# Patient Record
Sex: Male | Born: 1970 | Race: White | Hispanic: No | Marital: Married | Smoking: Current every day smoker
Health system: Southern US, Community
[De-identification: ages and names within clinical notes are randomized; demographics above are authoritative.]

## PROBLEM LIST (undated history)

## (undated) DIAGNOSIS — F419 Anxiety disorder, unspecified: Secondary | ICD-10-CM

## (undated) DIAGNOSIS — I341 Nonrheumatic mitral (valve) prolapse: Secondary | ICD-10-CM

## (undated) DIAGNOSIS — I10 Essential (primary) hypertension: Secondary | ICD-10-CM

## (undated) DIAGNOSIS — F410 Panic disorder [episodic paroxysmal anxiety] without agoraphobia: Secondary | ICD-10-CM

## (undated) DIAGNOSIS — N200 Calculus of kidney: Secondary | ICD-10-CM

## (undated) HISTORY — PX: HYDROCELE EXCISION / REPAIR: SUR1145

## (undated) HISTORY — PX: WRIST TENDON TRANSFER: SHX2676

## (undated) HISTORY — PX: OTHER SURGICAL HISTORY: SHX169

---

## 2009-08-01 ENCOUNTER — Emergency Department (HOSPITAL_BASED_OUTPATIENT_CLINIC_OR_DEPARTMENT_OTHER): Admission: EM | Admit: 2009-08-01 | Discharge: 2009-08-01 | Payer: Self-pay | Admitting: Emergency Medicine

## 2009-08-01 ENCOUNTER — Ambulatory Visit: Payer: Self-pay | Admitting: Diagnostic Radiology

## 2009-08-05 ENCOUNTER — Emergency Department (HOSPITAL_BASED_OUTPATIENT_CLINIC_OR_DEPARTMENT_OTHER): Admission: EM | Admit: 2009-08-05 | Discharge: 2009-08-05 | Payer: Self-pay | Admitting: Emergency Medicine

## 2010-09-26 LAB — COMPREHENSIVE METABOLIC PANEL
Albumin: 4.6 g/dL (ref 3.5–5.2)
BUN: 10 mg/dL (ref 6–23)
Calcium: 8.9 mg/dL (ref 8.4–10.5)
Creatinine, Ser: 0.8 mg/dL (ref 0.4–1.5)
GFR calc Af Amer: >60 mL/min (ref >60)
Glucose, Bld: 95 mg/dL (ref 70–99)
Total Protein: 8.1 g/dL (ref 6.0–8.3)

## 2010-09-26 LAB — DIFFERENTIAL
Eosinophils Absolute: 0.2 10*3/uL (ref 0.0–0.7)
Monocytes Absolute: 0.9 10*3/uL (ref 0.1–1.0)
Monocytes Relative: 10 % (ref 3–12)

## 2010-09-26 LAB — CBC
Platelets: 348 10*3/uL (ref 150–400)
RDW: 13.3 % (ref 11.5–15.5)

## 2011-04-28 ENCOUNTER — Emergency Department (HOSPITAL_BASED_OUTPATIENT_CLINIC_OR_DEPARTMENT_OTHER)
Admission: EM | Admit: 2011-04-28 | Discharge: 2011-04-28 | Disposition: A | Discharge status: Home / Self Care | Payer: Self-pay | Attending: Emergency Medicine | Admitting: Emergency Medicine

## 2011-04-28 ENCOUNTER — Other Ambulatory Visit: Payer: Self-pay

## 2011-04-28 ENCOUNTER — Encounter: Payer: Self-pay | Admitting: *Deleted

## 2011-04-28 DIAGNOSIS — T50905A Adverse effect of unspecified drugs, medicaments and biological substances, initial encounter: Secondary | ICD-10-CM

## 2011-04-28 DIAGNOSIS — R61 Generalized hyperhidrosis: Secondary | ICD-10-CM | POA: Insufficient documentation

## 2011-04-28 DIAGNOSIS — T50995A Adverse effect of other drugs, medicaments and biological substances, initial encounter: Secondary | ICD-10-CM | POA: Insufficient documentation

## 2011-04-28 DIAGNOSIS — Y92009 Unspecified place in unspecified non-institutional (private) residence as the place of occurrence of the external cause: Secondary | ICD-10-CM | POA: Insufficient documentation

## 2011-04-28 DIAGNOSIS — G259 Extrapyramidal and movement disorder, unspecified: Secondary | ICD-10-CM | POA: Insufficient documentation

## 2011-04-28 HISTORY — DX: Panic disorder (episodic paroxysmal anxiety): F41.0

## 2011-04-28 HISTORY — DX: Anxiety disorder, unspecified: F41.9

## 2011-04-28 HISTORY — DX: Nonrheumatic mitral (valve) prolapse: I34.1

## 2011-04-28 MED ORDER — LORAZEPAM 1 MG PO TABS
1.0000 mg | ORAL_TABLET | Freq: Once | ORAL | Status: AC
  Administered 2011-04-28: 1 mg via ORAL
  Filled 2011-04-28: qty 1

## 2011-04-28 NOTE — ED Notes (Signed)
Patient states he has a long hx of mild Mitral Valve Prolapse since age 13yrs old.  State today at 0900 am he accidentally took some OTC cold meds with pseudoephrine in it instead of ibuprofen.  States he took 2 pills at 0900 and did not get any relief of his tooth pain and so he took 2 more at 1000.  Became very anxious approximately 45 minutes later and is now worried that he has done something to his heart.  States he has a hx of anxiety and panic attacks.

## 2011-04-28 NOTE — ED Provider Notes (Signed)
History     CSN: [id number] Arrival date & time: 04/28/2011  1:03 PM   First MD Initiated Contact with Patient 04/28/11 1338      Chief Complaint  Patient presents with  . Shaking    took otc cold meds instead of ibuprofen at 9am today.  Feels shakey    (Consider location/radiation/quality/duration/timing/severity/associated sxs/prior treatment) HPI Comments: Pt accidentally took a cold medicine containing sudafed.  He though he was taking ibuprofen.  ~ 1 hr later he started feeling shaky and a "little sweaty" and his heart was pounding hard.  Denies CP.  He took the medicine ~ 1000 and at exam time says he feels like it is wearing off.  He was taking the ibuprofen for dental pain.  The history is provided by the patient and the spouse. No language interpreter was used.    Past Medical History  Diagnosis Date  . MVP (mitral valve prolapse)   . Anxiety   . Panic attacks     Past Surgical History  Procedure Date  . Kidney stones   . Wrist tendon transfer   . Hydrocele excision / repair     History reviewed. No pertinent family history.  History  Substance Use Topics  . Smoking status: Former Smoker -- 1.0 packs/day for 10 years  . Smokeless tobacco: Not on file  . Alcohol Use: Yes     occassionally      Review of Systems  Constitutional: Negative for fever.  HENT: Positive for dental problem.   Cardiovascular: Negative for chest pain.       Tachycardia  All other systems reviewed and are negative.    Allergies  Biaxin  Home Medications   Current Outpatient Rx  Name Route Sig Dispense Refill  . AMOXICILL-CLARITHRO-LANSOPRAZ PO MISC Oral Take by mouth 2 (two) times daily. Follow package directions.     . IBUPROFEN 200 MG PO TABS Oral Take 200 mg by mouth every 6 (six) hours as needed.        BP 110/71  Pulse 120  Temp(Src) 98.9 F (37.2 C) (Oral)  Resp 22  Ht 5\' 5"  (1.651 m)  Wt 190 lb (86.183 kg)  BMI 31.62 kg/m2  SpO2 100%  Physical Exam    Nursing note and vitals reviewed. Constitutional: He is oriented to person, place, and time. He appears well-developed and well-nourished.  HENT:  Head: Normocephalic and atraumatic.  Eyes: EOM are normal.  Neck: Normal range of motion.  Cardiovascular: Regular rhythm, S1 normal, S2 normal, normal heart sounds and intact distal pulses.  Tachycardia present.  PMI is not displaced.  Exam reveals no S3, no S4, no distant heart sounds and no friction rub.   Pulmonary/Chest: Effort normal and breath sounds normal. No accessory muscle usage. Not tachypneic. No respiratory distress.  Abdominal: Soft. He exhibits no distension. There is no tenderness.  Musculoskeletal: Normal range of motion.  Neurological: He is alert and oriented to person, place, and time. No cranial nerve deficit. Coordination normal.  Skin: Skin is warm and dry.  Psychiatric: He has a normal mood and affect. His behavior is normal. Judgment normal.    ED Course  Procedures (including critical care time)  Labs Reviewed - No data to display No results found. 3:36 PM Pt had taken a cold medicine containing pseudoephedrine, and it gave him a tachycardia.  Ativan 1 mg po ordered.  The effect of the pseudoephedrine will wear off, and the Ativan will help calm him down.  1.  Adverse effects of medication       MDM  Explained to pt that the medicine would wear off and his HR would slow down.  He and his wife understand.    Medical screening examination/treatment/procedure(s) were conducted as a shared visit with non-physician practitioner(s) and myself.  I personally evaluated the patient during the encounter Osvaldo Human, M.D.      Worthy Rancher, PA 04/28/11 1411  Carleene Cooper III, MD 04/28/11 [phone]

## 2013-03-03 ENCOUNTER — Emergency Department (HOSPITAL_BASED_OUTPATIENT_CLINIC_OR_DEPARTMENT_OTHER): Payer: Self-pay

## 2013-03-03 ENCOUNTER — Encounter (HOSPITAL_BASED_OUTPATIENT_CLINIC_OR_DEPARTMENT_OTHER): Payer: Self-pay

## 2013-03-03 ENCOUNTER — Emergency Department (HOSPITAL_BASED_OUTPATIENT_CLINIC_OR_DEPARTMENT_OTHER)
Admission: EM | Admit: 2013-03-03 | Discharge: 2013-03-03 | Disposition: A | Discharge status: Home / Self Care | Payer: Self-pay | Attending: Emergency Medicine | Admitting: Emergency Medicine

## 2013-03-03 DIAGNOSIS — M545 Low back pain, unspecified: Secondary | ICD-10-CM | POA: Insufficient documentation

## 2013-03-03 DIAGNOSIS — N451 Epididymitis: Secondary | ICD-10-CM

## 2013-03-03 DIAGNOSIS — Z8679 Personal history of other diseases of the circulatory system: Secondary | ICD-10-CM | POA: Insufficient documentation

## 2013-03-03 DIAGNOSIS — Z8659 Personal history of other mental and behavioral disorders: Secondary | ICD-10-CM | POA: Insufficient documentation

## 2013-03-03 DIAGNOSIS — Z87891 Personal history of nicotine dependence: Secondary | ICD-10-CM | POA: Insufficient documentation

## 2013-03-03 DIAGNOSIS — Z87442 Personal history of urinary calculi: Secondary | ICD-10-CM | POA: Insufficient documentation

## 2013-03-03 DIAGNOSIS — Z792 Long term (current) use of antibiotics: Secondary | ICD-10-CM | POA: Insufficient documentation

## 2013-03-03 DIAGNOSIS — N453 Epididymo-orchitis: Secondary | ICD-10-CM | POA: Insufficient documentation

## 2013-03-03 HISTORY — DX: Calculus of kidney: N20.0

## 2013-03-03 LAB — URINALYSIS, ROUTINE W REFLEX MICROSCOPIC
Ketones, ur: 15 mg/dL — AB
Leukocytes, UA: NEGATIVE
Nitrite: NEGATIVE
Protein, ur: NEGATIVE mg/dL
Urobilinogen, UA: 1 mg/dL (ref 0.0–1.0)

## 2013-03-03 LAB — URINE MICROSCOPIC-ADD ON

## 2013-03-03 MED ORDER — CEFTRIAXONE SODIUM 250 MG IJ SOLR
250.0000 mg | Freq: Once | INTRAMUSCULAR | Status: AC
  Administered 2013-03-03: 250 mg via INTRAMUSCULAR
  Filled 2013-03-03: qty 250

## 2013-03-03 MED ORDER — DOXYCYCLINE HYCLATE 100 MG PO CAPS: 100.0000 mg | ORAL_CAPSULE | Freq: Two times a day (BID) | ORAL | Status: DC

## 2013-03-03 MED ORDER — HYDROCODONE-ACETAMINOPHEN 5-325 MG PO TABS
2.0000 | ORAL_TABLET | Freq: Once | ORAL | Status: AC
  Administered 2013-03-03: 2 via ORAL
  Filled 2013-03-03: qty 2

## 2013-03-03 MED ORDER — LIDOCAINE HCL (PF) 1 % IJ SOLN
INTRAMUSCULAR | Status: AC
  Administered 2013-03-03: 2.1 mL
  Filled 2013-03-03: qty 5

## 2013-03-03 MED ORDER — ONDANSETRON HCL 4 MG PO TABS: 4.0000 mg | ORAL_TABLET | Freq: Four times a day (QID) | ORAL | Status: DC

## 2013-03-03 MED ORDER — ONDANSETRON 4 MG PO TBDP
4.0000 mg | ORAL_TABLET | Freq: Once | ORAL | Status: AC
  Administered 2013-03-03: 4 mg via ORAL
  Filled 2013-03-03: qty 1

## 2013-03-03 MED ORDER — HYDROCODONE-ACETAMINOPHEN 5-325 MG PO TABS: 2.0000 | ORAL_TABLET | ORAL | Status: DC | PRN

## 2013-03-03 NOTE — ED Notes (Signed)
MD at bedside. 

## 2013-03-03 NOTE — ED Notes (Signed)
Left testicle pain x 1 month-"going up into my abd" and points to flank x 3-4 days

## 2013-03-03 NOTE — ED Provider Notes (Signed)
Scribed for No att. providers found, the patient was seen in room MH11/MH11. This chart was scribed by Lewanda Rife, ED scribe. Patient's care was started at 2019  CSN: [id number]     Arrival date & time 03/03/13  2005 History   First MD Initiated Contact with Patient 03/03/13 2016     Chief Complaint  Patient presents with  . Testicle Pain   (Consider location/radiation/quality/duration/timing/severity/associated sxs/prior Treatment) The history is provided by the patient.   HPI Comments: Omar Hardy is a 42 y.o. male who presents to the Emergency Department complaining of waxing and waning moderate left testicular pain radiating to left inguinal region onset 1 month. Reports left testicular pain is constant for past 4 days. Reports associated lower back pain. Reports testicular pain is aggravated when standing up and driving a car and alleviated by nothing. Denies associated penile discharge, hematuria, any urinary/bowel changes or symptoms, abdominal pain, recent injury or trauma, and fevers.Reports PMH of kidney stones, lithotripsy, and hydrocele excision. Denies hx of other abdominal surgery. Reports taking 200 mg of ibuprofen 8 times a day with no relief of symptoms.   Past Medical History  Diagnosis Date  . MVP (mitral valve prolapse)   . Anxiety   . Panic attacks   . Kidney stone    Past Surgical History  Procedure Laterality Date  . Kidney stones    . Wrist tendon transfer    . Hydrocele excision / repair     No family history on file. History  Substance Use Topics  . Smoking status: Former Smoker -- 1.00 packs/day for 10 years  . Smokeless tobacco: Not on file  . Alcohol Use: Yes     Comment: occassionally    Review of Systems  Genitourinary: Positive for testicular pain.  Musculoskeletal: Positive for back pain.   A complete 10 system review of systems was obtained and all systems are negative except as noted in the HPI and PMH.    Allergies   Clarithromycin  Home Medications   Current Outpatient Rx  Name  Route  Sig  Dispense  Refill  . amoxicillin-clarithromycin-lansoprazole (PREVPAC) combo pack   Oral   Take by mouth 2 (two) times daily. Follow package directions.          Marland Kitchen doxycycline (VIBRAMYCIN) 100 MG capsule   Oral   Take 1 capsule (100 mg total) by mouth 2 (two) times daily.   20 capsule   0   . HYDROcodone-acetaminophen (NORCO/VICODIN) 5-325 MG per tablet   Oral   Take 2 tablets by mouth every 4 (four) hours as needed for pain.   10 tablet   0   . ibuprofen (ADVIL,MOTRIN) 200 MG tablet   Oral   Take 200 mg by mouth every 6 (six) hours as needed.           . ondansetron (ZOFRAN) 4 MG tablet   Oral   Take 1 tablet (4 mg total) by mouth every 6 (six) hours.   12 tablet   0    BP 125/70  Pulse 104  Temp(Src) 98.9 F (37.2 C) (Oral)  Resp 16  Ht 5\' 5"  (1.651 m)  Wt 185 lb (83.915 kg)  BMI 30.79 kg/m2  SpO2 99% Physical Exam  Nursing note and vitals reviewed. Constitutional: He is oriented to person, place, and time. He appears well-developed and well-nourished. No distress.  HENT:  Head: Normocephalic and atraumatic.  Eyes: Conjunctivae and EOM are normal.  Neck: Neck supple. No tracheal deviation  present.  Cardiovascular: Normal rate and regular rhythm.   Pulmonary/Chest: Effort normal and breath sounds normal. No respiratory distress.  Abdominal: Soft. Normal appearance and bowel sounds are normal. There is no tenderness. No hernia. Hernia confirmed negative in the right inguinal area and confirmed negative in the left inguinal area.    Genitourinary: Right testis shows no tenderness. Left testis shows tenderness.  Well-healed left inguinal scar at base of scrotum. Tenderness at the base of the left testicle. Normal lie. No overlying skin change. No appreciable hernia. Tenderness over left inguinal region.     Musculoskeletal: Normal range of motion.  Lymphadenopathy:       Right: No  inguinal adenopathy present.       Left: No inguinal adenopathy present.  Neurological: He is alert and oriented to person, place, and time.  Skin: Skin is warm and dry. No erythema.  Psychiatric: He has a normal mood and affect. His behavior is normal.    ED Course  Procedures (including critical care time) Medications  HYDROcodone-acetaminophen (NORCO/VICODIN) 5-325 MG per tablet 2 tablet (2 tablets Oral Given 03/03/13 2034)  ondansetron (ZOFRAN-ODT) disintegrating tablet 4 mg (4 mg Oral Given 03/03/13 2035)  cefTRIAXone (ROCEPHIN) injection 250 mg (250 mg Intramuscular Given 03/03/13 2140)  lidocaine (PF) (XYLOCAINE) 1 % injection (2.1 mLs  Given 03/03/13 2140)    Labs Review Labs Reviewed  URINALYSIS, ROUTINE W REFLEX MICROSCOPIC - Abnormal; Notable for the following:    Specific Gravity, Urine 1.038 (*)    Hgb urine dipstick TRACE (*)    Bilirubin Urine SMALL (*)    Ketones, ur 15 (*)    All other components within normal limits  URINE MICROSCOPIC-ADD ON - Abnormal; Notable for the following:    Crystals CA OXALATE CRYSTALS (*)    All other components within normal limits   Imaging Review Ct Abdomen Pelvis Wo Contrast  03/03/2013   *RADIOLOGY REPORT*  Clinical Data: Left flank and groin pain, history of hematuria  CT ABDOMEN AND PELVIS WITHOUT CONTRAST  Technique:  Multidetector CT imaging of the abdomen and pelvis was performed following the standard protocol without intravenous contrast.  Comparison: 08/01/2009; Testicular ultrasound - earlier same day  Findings:  The lack of intravenous contrast limits the ability to evaluate solid abdominal organs.  There is an approximately 4 mm nonobstructing stone within the inferior pole of the left kidney (image 41, series 2) as well as an additional approximately 3 mm nonobstructing stone in the mid aspect of the left kidney (image 33).  There is a punctate (< 2 mm) nonobstructing stone within the inferior pole of the left kidney. No definite  right-sided renal stones.  No stones are seen along the expected course of either ureter or within the urinary bladder. Normal noncontrast appearance of the urinary bladder.  No urinary obstruction or perinephric stranding.  Normal noncontrast appearance of bilateral adrenal glands, pancreas and spleen.  Incidental note is made of a small splenule.  Normal hepatic contour.  The gallbladder is under distended but otherwise unremarkable on this noncontrast examination.  No ascites.  Scattered colonic diverticulosis without evidence of diverticulitis.  Moderate colonic stool without evidence of obstruction.  Normal noncontrast appearance of the appendix.  No pneumoperitoneum, pneumatosis or portal venous gas.  Normal caliber of the abdominal aorta.  No definite bulky retroperitoneal, mesenteric, pelvic or inguinal lymphadenopathy on this noncontrast examination.  Prostatic calcifications of normal size prostate.  No free fluid in the pelvis.  Limited visualization lower thorax demonstrates  minimal bibasilar subsegmental atelectasis.  Normal heart size.  No pericardial effusion.  No acute or aggressive osseous abnormalities.  Mild degenerative changes of the lumbar spine, likely worse at L5 - S1.  A small limbus body is noted about the anterior aspect of the superior endplate of the L5 vertebral body.  IMPRESSION:  1.  No explanation for the patient's left flank and groin pain. Specifically, no evidence of enteric urinary obstruction. 2.  Nonobstructed left sided nephrolithiasis as above. 3.  Colonic diverticulosis without evidence of diverticulitis.   Original Report Authenticated By: Tacey Ruiz, MD   US Scrotum  03/03/2013   CLINICAL DATA:  Left testicular pain.  EXAM: SCROTAL ULTRASOUND  DOPPLER ULTRASOUND OF THE TESTICLES  TECHNIQUE: Complete ultrasound examination of the testicles, epididymis, and other scrotal structures was performed. Color and spectral Doppler ultrasound were also utilized to evaluate blood  flow to the testicles.  COMPARISON:  None.  FINDINGS: Right testis: 4.7 x 2.2 x 3.2 cm. Normal size and echotexture. No focal abnormality. Normal arterial and venous blood flow.  Left testis: 4.3 x 2.0 x 2.8 cm. Normal size and echotexture. No focal abnormality. Normal arterial and venous blood flow.  Right epididymis:  Normal in size and appearance.  Left epididymis: Increased blood flow, mildly prominent. Findings suggest epididymitis.  Hydrocele:  Small right sided hydrocele.  Varicocele:  Absent  Pulsed Doppler interrogation of both testes demonstrates low resistance arterial and venous waveforms bilaterally.  IMPRESSION: Increased blood flow and heterogeneity within the left epididymis compatible with epididymitis.   Electronically Signed   By: Charlett Nose   On: 03/03/2013 21:27   [place] Art/ven Flow Abd Pelv Doppler  03/03/2013   CLINICAL DATA:  Left testicular pain.  EXAM: SCROTAL ULTRASOUND  DOPPLER ULTRASOUND OF THE TESTICLES  TECHNIQUE: Complete ultrasound examination of the testicles, epididymis, and other scrotal structures was performed. Color and spectral Doppler ultrasound were also utilized to evaluate blood flow to the testicles.  COMPARISON:  None.  FINDINGS: Right testis: 4.7 x 2.2 x 3.2 cm. Normal size and echotexture. No focal abnormality. Normal arterial and venous blood flow.  Left testis: 4.3 x 2.0 x 2.8 cm. Normal size and echotexture. No focal abnormality. Normal arterial and venous blood flow.  Right epididymis:  Normal in size and appearance.  Left epididymis: Increased blood flow, mildly prominent. Findings suggest epididymitis.  Hydrocele:  Small right sided hydrocele.  Varicocele:  Absent  Pulsed Doppler interrogation of both testes demonstrates low resistance arterial and venous waveforms bilaterally.  IMPRESSION: Increased blood flow and heterogeneity within the left epididymis compatible with epididymitis.   Electronically Signed   By: Charlett Nose   On: 03/03/2013 21:27    MDM     1. Epididymitis    One month of left testicle pain has been intermittent but constant in the past 3 days. No injury. No vomiting, dysuria hematuria. No discharge. No abdominal pain or back pain  Ultrasound shows left-sided epididymitis. No torsion. UA with infection. No ureteral stones.  Will treat with rocephin and doxycycline.  Scrotal support, followup with urolgoy.   I personally performed the services described in this documentation, which was scribed in my presence. The recorded information has been reviewed and is accurate.     Glynn Octave, MD 03/03/13 2340

## 2016-03-19 ENCOUNTER — Encounter (HOSPITAL_BASED_OUTPATIENT_CLINIC_OR_DEPARTMENT_OTHER): Payer: Self-pay | Admitting: Emergency Medicine

## 2016-03-19 ENCOUNTER — Emergency Department (HOSPITAL_BASED_OUTPATIENT_CLINIC_OR_DEPARTMENT_OTHER)
Admission: EM | Admit: 2016-03-19 | Discharge: 2016-03-19 | Disposition: A | Discharge status: Home / Self Care | Payer: Managed Care, Other (non HMO) | Attending: Physician Assistant | Admitting: Physician Assistant

## 2016-03-19 DIAGNOSIS — IMO0002 Reserved for concepts with insufficient information to code with codable children: Secondary | ICD-10-CM

## 2016-03-19 DIAGNOSIS — W260XXA Contact with knife, initial encounter: Secondary | ICD-10-CM | POA: Insufficient documentation

## 2016-03-19 DIAGNOSIS — S51812A Laceration without foreign body of left forearm, initial encounter: Secondary | ICD-10-CM | POA: Insufficient documentation

## 2016-03-19 DIAGNOSIS — Z79899 Other long term (current) drug therapy: Secondary | ICD-10-CM | POA: Insufficient documentation

## 2016-03-19 DIAGNOSIS — Y929 Unspecified place or not applicable: Secondary | ICD-10-CM | POA: Insufficient documentation

## 2016-03-19 DIAGNOSIS — F17200 Nicotine dependence, unspecified, uncomplicated: Secondary | ICD-10-CM | POA: Insufficient documentation

## 2016-03-19 DIAGNOSIS — S59912A Unspecified injury of left forearm, initial encounter: Secondary | ICD-10-CM | POA: Diagnosis present

## 2016-03-19 DIAGNOSIS — I10 Essential (primary) hypertension: Secondary | ICD-10-CM | POA: Insufficient documentation

## 2016-03-19 DIAGNOSIS — Y9389 Activity, other specified: Secondary | ICD-10-CM | POA: Diagnosis not present

## 2016-03-19 DIAGNOSIS — Y999 Unspecified external cause status: Secondary | ICD-10-CM | POA: Diagnosis not present

## 2016-03-19 HISTORY — DX: Essential (primary) hypertension: I10

## 2016-03-19 MED ORDER — LIDOCAINE HCL 1 % IJ SOLN
INTRAMUSCULAR | Status: AC
  Filled 2016-03-19: qty 20

## 2016-03-19 MED ORDER — ACETAMINOPHEN 325 MG PO TABS
650.0000 mg | ORAL_TABLET | Freq: Once | ORAL | Status: AC
  Administered 2016-03-19: 650 mg via ORAL
  Filled 2016-03-19: qty 2

## 2016-03-19 MED ORDER — ONDANSETRON 8 MG PO TBDP
8.0000 mg | ORAL_TABLET | Freq: Once | ORAL | Status: AC
  Administered 2016-03-19: 8 mg via ORAL

## 2016-03-19 MED ORDER — IBUPROFEN 800 MG PO TABS
800.0000 mg | ORAL_TABLET | Freq: Once | ORAL | Status: AC
  Administered 2016-03-19: 800 mg via ORAL
  Filled 2016-03-19: qty 1

## 2016-03-19 MED ORDER — ONDANSETRON HCL 8 MG PO TABS
ORAL_TABLET | ORAL | Status: AC
  Filled 2016-03-19: qty 1

## 2016-03-19 NOTE — ED Triage Notes (Addendum)
Patient reports he cut his left arm on a knife when putting his tools back in the toolbox. Reports this occurred at 1818 and has not been able to stop bleeding. Bleeding controlled at present.  Patient in NAD.

## 2016-03-19 NOTE — ED Provider Notes (Signed)
[facility] Note   CSN: [id number] Arrival date & time: 03/19/16  2013  By signing my name below, I, Arianna Nassar, attest that this documentation has been prepared under the direction and in the presence of Elissa Grieshop Randall AnLyn Deysha Cartier, MD.  Electronically Signed: Octavia HeirArianna Nassar, ED Scribe. 03/19/16. 9:57 PM.    History   Chief Complaint Chief Complaint  Patient presents with  . Arm Injury    The history is provided by the patient. No language interpreter was used.   HPI Comments: Omar Hardy is a 45 y.o. male who has a PMhx of mitral valve prolapse presents to the Emergency Department complaining of a sudden onset, unchanged, moderate, left forearm laceration onset about 1 hour ago. Pt was putting tools back in his toolbox when a razor like object cut his arm. Bleeding is currently controlled. There are no other complaints. Pt is up to date on his tetanus shot.   Past Medical History:  Diagnosis Date  . Anxiety   . Hypertension   . Kidney stone   . MVP (mitral valve prolapse)   . Panic attacks     There are no active problems to display for this patient.   Past Surgical History:  Procedure Laterality Date  . HYDROCELE EXCISION / REPAIR    . kidney stones    . WRIST TENDON TRANSFER         Home Medications    Prior to Admission medications   Medication Sig Start Date End Date Taking? Authorizing Provider  citalopram (CELEXA) 20 MG tablet Take 20 mg by mouth daily.   Yes Historical Provider, MD  propranolol (INDERAL) 40 MG tablet Take 40 mg by mouth daily.   Yes Historical Provider, MD  amoxicillin-clarithromycin-lansoprazole [facility](PREVPAC) combo pack Take by mouth 2 (two) times daily. Follow package directions.     Historical Provider, MD  doxycycline (VIBRAMYCIN) 100 MG capsule Take 1 capsule (100 mg total) by mouth 2 (two) times daily. 03/03/13   Glynn OctaveStephen Rancour, MD  HYDROcodone-acetaminophen (NORCO/VICODIN) 5-325 MG per tablet Take 2 tablets by mouth  every 4 (four) hours as needed for pain. 03/03/13   Glynn OctaveStephen Rancour, MD  ibuprofen (ADVIL,MOTRIN) 200 MG tablet Take 200 mg by mouth every 6 (six) hours as needed.      Historical Provider, MD  ondansetron (ZOFRAN) 4 MG tablet Take 1 tablet (4 mg total) by mouth every 6 (six) hours. 03/03/13   Glynn OctaveStephen Rancour, MD    Family History History reviewed. No pertinent family history.  Social History Social History  Substance Use Topics  . Smoking status: Current Every Day Smoker    Packs/day: 0.50    Years: 10.00  . Smokeless tobacco: Never Used  . Alcohol use Yes     Comment: occassionally     Allergies   Clarithromycin   Review of Systems Review of Systems   A complete 10 system review of systems was obtained and all systems are negative except as noted in the HPI and PMH.   Physical Exam Updated Vital Signs BP 129/77 (BP Location: Right Arm)   Pulse 93   Temp 98.7 F (37.1 C) (Oral)   Resp 16   Ht 5\' 5"  (1.651 m)   Wt 190 lb (86.2 kg)   SpO2 96%   BMI 31.62 kg/m   Physical Exam  Constitutional: He is oriented to person, place, and time. He appears well-developed and well-nourished.  HENT:  Head: Normocephalic.  Eyes: EOM are normal.  Neck: Normal range  of motion.  Pulmonary/Chest: Effort normal.  Abdominal: He exhibits no distension.  Musculoskeletal: Normal range of motion.  Neurological: He is alert and oriented to person, place, and time.  Skin:  8 cm laceration superficial to lateral aspect of left arm Very superficial, no subcutaneous fat, no tendon involvement. Good ROM distally Good cap refill distally Good sensation distally  Psychiatric: He has a normal mood and affect.  Nursing note and vitals reviewed.    ED Treatments / Results  DIAGNOSTIC STUDIES: Oxygen Saturation is 96% on RA, adequate by my interpretation.  COORDINATION OF CARE:  9:56 PM Discussed treatment plan which includes suture the area with pt at bedside and pt agreed to  plan.  Labs (all labs ordered are listed, but only abnormal results are displayed) Labs Reviewed - No data to display  EKG  EKG Interpretation None       Radiology No results found.  Procedures Procedures (including critical care time)  Medications Ordered in ED Medications - No data to display   Initial Impression / Assessment and Plan / ED Course  I have reviewed the triage vital signs and the nursing notes.  Pertinent labs & imaging results that were available during my care of the patient were reviewed by me and considered in my medical decision making (see chart for details).  Clinical Course    Laceration to L forearm.  LACERATION REPAIR Performed by: Arlana Hove Authorized by: Arlana Hove Consent: Verbal consent obtained. Risks and benefits: risks, benefits and alternatives were discussed Consent given by: patient Patient identity confirmed: provided demographic data Prepped and Draped in normal sterile fashion Wound explored  Laceration Location: L forearm 8 cm. Superficial. Diatbal sensation and function intact.     No Foreign Bodies seen or palpated  Anesthesia: local infiltration  Local anesthetic: lidocaine 1 percent wo epinephrine  Anesthetic total: 7 ml  Irrigation method: syringe Amount of cleaning: standard  Skin closure: 5 4-0 chromic interupted. *  Patient tolerance: Patient tolerated the procedure well with no immediate complications.   Final Clinical Impressions(s) / ED Diagnoses   Final diagnoses:  None   I personally performed the services described in this documentation, which was scribed in my presence. The recorded information has been reviewed and is accurate.  .  New Prescriptions New Prescriptions   No medications on file     Erial Fikes Randall An, MD 03/19/16 2218

## 2016-03-19 NOTE — Discharge Instructions (Signed)
Sutures should dissolve within 7-10 days. If not please use hydroperoxide and Q-tip to help dissolve. Please return if signs of infection.

## 2016-03-19 NOTE — ED Notes (Addendum)
Pt alert, NAD, calm, interactive, resps e/u, no dyspnea noted, steady gait. [facility]age[facility]

## 2018-04-07 ENCOUNTER — Encounter (HOSPITAL_BASED_OUTPATIENT_CLINIC_OR_DEPARTMENT_OTHER): Payer: Self-pay | Admitting: Emergency Medicine

## 2018-04-07 ENCOUNTER — Emergency Department (HOSPITAL_BASED_OUTPATIENT_CLINIC_OR_DEPARTMENT_OTHER)
Admission: EM | Admit: 2018-04-07 | Discharge: 2018-04-07 | Disposition: A | Discharge status: Home / Self Care | Payer: PRIVATE HEALTH INSURANCE | Attending: Emergency Medicine | Admitting: Emergency Medicine

## 2018-04-07 ENCOUNTER — Other Ambulatory Visit: Payer: Self-pay

## 2018-04-07 ENCOUNTER — Emergency Department (HOSPITAL_BASED_OUTPATIENT_CLINIC_OR_DEPARTMENT_OTHER): Payer: PRIVATE HEALTH INSURANCE

## 2018-04-07 DIAGNOSIS — I10 Essential (primary) hypertension: Secondary | ICD-10-CM | POA: Diagnosis not present

## 2018-04-07 DIAGNOSIS — Z87442 Personal history of urinary calculi: Secondary | ICD-10-CM | POA: Insufficient documentation

## 2018-04-07 DIAGNOSIS — N201 Calculus of ureter: Secondary | ICD-10-CM

## 2018-04-07 DIAGNOSIS — R109 Unspecified abdominal pain: Secondary | ICD-10-CM | POA: Diagnosis present

## 2018-04-07 DIAGNOSIS — F17210 Nicotine dependence, cigarettes, uncomplicated: Secondary | ICD-10-CM | POA: Diagnosis not present

## 2018-04-07 LAB — URINALYSIS, ROUTINE W REFLEX MICROSCOPIC
BILIRUBIN URINE: NEGATIVE
Glucose, UA: NEGATIVE mg/dL
KETONES UR: NEGATIVE mg/dL
Leukocytes, UA: NEGATIVE
NITRITE: NEGATIVE
PROTEIN: NEGATIVE mg/dL
Specific Gravity, Urine: 1.025 (ref 1.005–1.030)
pH: 5.5 (ref 5.0–8.0)

## 2018-04-07 LAB — BASIC METABOLIC PANEL
ANION GAP: 11 (ref 5–15)
BUN: 10 mg/dL (ref 6–20)
CO2: 24 mmol/L (ref 22–32)
Calcium: 8.7 mg/dL — ABNORMAL LOW (ref 8.9–10.3)
Chloride: 103 mmol/L (ref 98–111)
Creatinine, Ser: 0.81 mg/dL (ref 0.61–1.24)
GFR calc non Af Amer: >60 mL/min (ref >60)
Glucose, Bld: 91 mg/dL (ref 70–99)
Potassium: 4.2 mmol/L (ref 3.5–5.1)
SODIUM: 138 mmol/L (ref 135–145)

## 2018-04-07 LAB — URINALYSIS, MICROSCOPIC (REFLEX)

## 2018-04-07 LAB — CBC
HCT: 43.6 % (ref 39.0–52.0)
Hemoglobin: 14.7 g/dL (ref 13.0–17.0)
MCH: 30.4 pg (ref 26.0–34.0)
MCHC: 33.7 g/dL (ref 30.0–36.0)
MCV: 90.3 fL (ref 78.0–100.0)
Platelets: 284 10*3/uL (ref 150–400)
RBC: 4.83 MIL/uL (ref 4.22–5.81)
RDW: 14.9 % (ref 11.5–15.5)
WBC: 8 10*3/uL (ref 4.0–10.5)

## 2018-04-07 MED ORDER — IBUPROFEN 600 MG PO TABS: 600.0000 mg | ORAL_TABLET | Freq: Three times a day (TID) | ORAL | 0 refills | Status: DC | PRN

## 2018-04-07 MED ORDER — OXYCODONE-ACETAMINOPHEN 5-325 MG PO TABS: 1.0000 | ORAL_TABLET | ORAL | 0 refills | Status: DC | PRN

## 2018-04-07 MED ORDER — KETOROLAC TROMETHAMINE 30 MG/ML IJ SOLN
30.0000 mg | Freq: Once | INTRAMUSCULAR | Status: AC
  Administered 2018-04-07: 30 mg via INTRAVENOUS
  Filled 2018-04-07: qty 1

## 2018-04-07 MED ORDER — TAMSULOSIN HCL 0.4 MG PO CAPS: 0.4000 mg | ORAL_CAPSULE | Freq: Every day | ORAL | 0 refills | Status: DC

## 2018-04-07 MED ORDER — ONDANSETRON 8 MG PO TBDP: 8.0000 mg | ORAL_TABLET | Freq: Three times a day (TID) | ORAL | 0 refills | Status: DC | PRN

## 2018-04-07 NOTE — ED Provider Notes (Signed)
[facility] EMERGENCY DEPARTMENT Provider Note   CSN: [id number] Arrival date & time: 04/07/18  1149     History   Chief Complaint Chief Complaint  Patient presents with  . Flank Pain    HPI Omar Hardy is a 47 y.o. male.  HPI 47 year old male presents the emergency department severe intermittent left flank pain with radiation towards his left groin.  He does have a history of kidney stones.  His pain is been waxing and waning.  Is been present for several days and was worse yesterday.  Still present today but not as severe.  No fevers or chills.  Nausea without vomiting.  No diarrhea.  Symptoms are moderate in severity.  He has required urologic surgery before for stone extraction.  Currently his pain is mild in severity   Past Medical History:  Diagnosis Date  . Anxiety   . Hypertension   . Kidney stone   . MVP (mitral valve prolapse)   . Panic attacks     There are no active problems to display for this patient.   Past Surgical History:  Procedure Laterality Date  . HYDROCELE EXCISION / REPAIR    . kidney stones    . WRIST TENDON TRANSFER          Home Medications    Prior to Admission medications   Medication Sig Start Date End Date Taking? Authorizing Provider  citalopram (CELEXA) 20 MG tablet Take 20 mg by mouth daily.    [provider]  doxycycline (VIBRAMYCIN) 100 MG capsule Take 1 capsule (100 mg total) by mouth 2 (two) times daily. 03/03/13   Rancour, Jeannett Senior, MD  ibuprofen (ADVIL,MOTRIN) 600 MG tablet Take 1 tablet (600 mg total) by mouth every 8 (eight) hours as needed. 04/07/18   Azalia Bilis, MD  ondansetron (ZOFRAN ODT) 8 MG disintegrating tablet Take 1 tablet (8 mg total) by mouth every 8 (eight) hours as needed for nausea or vomiting. 04/07/18   Azalia Bilis, MD  oxyCODONE-acetaminophen (PERCOCET/ROXICET) 5-325 MG tablet Take 1 tablet by mouth every 4 (four) hours as needed for severe pain. 04/07/18   Azalia Bilis, MD    propranolol (INDERAL) 40 MG tablet Take 40 mg by mouth daily.    [provider]  tamsulosin (FLOMAX) 0.4 MG CAPS capsule Take 1 capsule (0.4 mg total) by mouth daily. 04/07/18   Azalia Bilis, MD    Family History History reviewed. No pertinent family history.  Social History Social History   Tobacco Use  . Smoking status: Current Every Day Smoker    Packs/day: 0.50    Years: 10.00    Pack years: 5.00  . Smokeless tobacco: Never Used  Substance Use Topics  . Alcohol use: Yes    Comment: occassionally  . Drug use: No     Allergies   Clarithromycin   Review of Systems Review of Systems  All other systems reviewed and are negative.    Physical Exam Updated Vital Signs BP 117/81   Pulse 79   Temp 98.5 F (36.9 C) (Oral)   Resp 18   Ht 5\' 6"  (1.676 m)   Wt 88 kg   SpO2 96%   BMI 31.31 kg/m   Physical Exam  Constitutional: He is oriented to person, place, and time. He appears well-developed and well-nourished.  HENT:  Head: Normocephalic and atraumatic.  Eyes: EOM are normal.  Neck: Normal range of motion.  Cardiovascular: Normal rate, regular rhythm and normal heart sounds.  Pulmonary/Chest: Effort  normal and breath sounds normal. No respiratory distress.  Abdominal: Soft. He exhibits no distension. There is no tenderness.  Musculoskeletal: Normal range of motion.  Neurological: He is alert and oriented to person, place, and time.  Skin: Skin is warm and dry.  Psychiatric: He has a normal mood and affect. Judgment normal.  Nursing note and vitals reviewed.    ED Treatments / Results  Labs (all labs ordered are listed, but only abnormal results are displayed) Labs Reviewed  URINALYSIS, ROUTINE W REFLEX MICROSCOPIC - Abnormal; Notable for the following components:      Result Value   Hgb urine dipstick MODERATE (*)    All other components within normal limits  BASIC METABOLIC PANEL - Abnormal; Notable for the following components:   Calcium  8.7 (*)    All other components within normal limits  URINALYSIS, MICROSCOPIC (REFLEX) - Abnormal; Notable for the following components:   Bacteria, UA MANY (*)    All other components within normal limits  CBC    EKG None  Radiology Ct Renal Stone Study  Result Date: 04/07/2018 CLINICAL DATA:  Lower abdominal/flank region pain EXAM: CT ABDOMEN AND PELVIS WITHOUT CONTRAST TECHNIQUE: Multidetector CT imaging of the abdomen and pelvis was performed following the standard protocol without oral or IV contrast. COMPARISON:  March 03, 2013 FINDINGS: Lower chest: Lung bases are clear. A focus of calcification is noted in the right coronary artery on axial slice 1 series 2. Hepatobiliary: No focal liver lesions are evident on this noncontrast enhanced study. Gallbladder wall is not appreciably thickened. There is no biliary duct dilatation. Pancreas: There is no pancreatic mass or inflammatory focus. Spleen: No splenic lesions are identified. There is an accessory spleen medial to the spleen anteriorly. Adrenals/Urinary Tract: Adrenals bilaterally appear unremarkable. There is no renal mass on either side. There is moderate hydronephrosis on the left. There is no appreciable hydronephrosis on the right. There is a 4 x 4 mm calculus with an adjacent 2 x 2 mm calculus in the lower pole of the left kidney. No intrarenal calculi are seen on the right. On the left, there is a calculus at the mid sacral level on the left measuring 5 x 4 mm. No other ureteral calculi are evident. Urinary bladder is midline with wall thickness within normal limits. Stomach/Bowel: There is no appreciable bowel wall or mesenteric thickening. There is no evident bowel obstruction. No free air or portal venous air. Vascular/Lymphatic: There are foci of common iliac artery atherosclerosis. No evident abdominal aortic aneurysm. Major mesenteric arterial vessels appear patent on this noncontrast enhanced study. There is no appreciable  adenopathy in the abdomen or pelvis. Reproductive: Prostate and seminal vesicles appear normal in size and contour. No evident pelvic mass. Other: Appendix appears normal. There is no abscess or ascites in the abdomen or pelvis. Musculoskeletal: There are no blastic or lytic bone lesions. There is no intramuscular or abdominal wall lesion. IMPRESSION: 1. 5 x 4 mm calculus in the left ureter at the mid sacral level with moderate hydronephrosis on the left. 2.  Nonobstructing calculi lower pole left kidney. 3. No evident bowel obstruction. No abscess in the abdomen or pelvis. Appendix appears normal. 4. Foci of common iliac artery calcification. Focus of calcification in right coronary artery. Electronically Signed   By: Bretta Bang III M.D.   On: 04/07/2018 14:18    Procedures Procedures (including critical care time)  Medications Ordered in ED Medications  ketorolac (TORADOL) 30 MG/ML injection 30 mg (  30 mg Intravenous Given 04/07/18 1411)     Initial Impression / Assessment and Plan / ED Course  I have reviewed the triage vital signs and the nursing notes.  Pertinent labs & imaging results that were available during my care of the patient were reviewed by me and considered in my medical decision making (see chart for details).     Pain controlled at this time.  5 x 4 mm UPJ stone.  Patient has a urologist in [facility].  He has been given a copy of his CT scan.  Home with standard stone precautions and standard medications.  Patient encouraged to return to the ER for new or worsening symptoms   Final Clinical Impressions(s) / ED Diagnoses   Final diagnoses:  Left ureteral stone    ED Discharge Orders         Ordered    ibuprofen (ADVIL,MOTRIN) 600 MG tablet  Every 8 hours PRN     04/07/18 1503    tamsulosin (FLOMAX) 0.4 MG CAPS capsule  Daily     04/07/18 1503    oxyCODONE-acetaminophen (PERCOCET/ROXICET) 5-325 MG tablet  Every 4 hours PRN     04/07/18 1503    ondansetron  (ZOFRAN ODT) 8 MG disintegrating tablet  Every 8 hours PRN     04/07/18 1503           Azalia Bilis, MD 04/07/18 1507

## 2018-04-07 NOTE — ED Triage Notes (Signed)
Patient states that he is having left lower abdominal paina dn flank pain - went to his PMD on Friday and was told that they thought it was a kidney stone but to come here if he did not pass it by yesterday -

## 2018-04-07 NOTE — Discharge Instructions (Signed)
Please call your urologist for follow-up

## 2021-04-13 ENCOUNTER — Emergency Department (HOSPITAL_BASED_OUTPATIENT_CLINIC_OR_DEPARTMENT_OTHER): Payer: BC Managed Care – PPO

## 2021-04-13 ENCOUNTER — Encounter (HOSPITAL_BASED_OUTPATIENT_CLINIC_OR_DEPARTMENT_OTHER): Payer: Self-pay

## 2021-04-13 ENCOUNTER — Other Ambulatory Visit: Payer: Self-pay

## 2021-04-13 ENCOUNTER — Emergency Department (HOSPITAL_BASED_OUTPATIENT_CLINIC_OR_DEPARTMENT_OTHER)
Admission: EM | Admit: 2021-04-13 | Discharge: 2021-04-13 | Disposition: A | Discharge status: Home / Self Care | Payer: BC Managed Care – PPO | Attending: Emergency Medicine | Admitting: Emergency Medicine

## 2021-04-13 DIAGNOSIS — I10 Essential (primary) hypertension: Secondary | ICD-10-CM | POA: Insufficient documentation

## 2021-04-13 DIAGNOSIS — N50812 Left testicular pain: Secondary | ICD-10-CM | POA: Diagnosis present

## 2021-04-13 DIAGNOSIS — N451 Epididymitis: Secondary | ICD-10-CM | POA: Insufficient documentation

## 2021-04-13 DIAGNOSIS — F17210 Nicotine dependence, cigarettes, uncomplicated: Secondary | ICD-10-CM | POA: Insufficient documentation

## 2021-04-13 DIAGNOSIS — Z79899 Other long term (current) drug therapy: Secondary | ICD-10-CM | POA: Diagnosis not present

## 2021-04-13 LAB — URINALYSIS, MICROSCOPIC (REFLEX)

## 2021-04-13 LAB — URINALYSIS, ROUTINE W REFLEX MICROSCOPIC
Bilirubin Urine: NEGATIVE
Glucose, UA: NEGATIVE mg/dL
Ketones, ur: NEGATIVE mg/dL
Leukocytes,Ua: NEGATIVE
Nitrite: NEGATIVE
Protein, ur: NEGATIVE mg/dL
Specific Gravity, Urine: 1.01 (ref 1.005–1.030)
pH: 5.5 (ref 5.0–8.0)

## 2021-04-13 MED ORDER — LEVOFLOXACIN 500 MG PO TABS: 500.0000 mg | ORAL_TABLET | Freq: Every day | ORAL | 0 refills | Status: DC

## 2021-04-13 MED ORDER — LEVOFLOXACIN 500 MG PO TABS
500.0000 mg | ORAL_TABLET | Freq: Once | ORAL | Status: AC
  Administered 2021-04-13: 500 mg via ORAL
  Filled 2021-04-13: qty 1

## 2021-04-13 NOTE — ED Provider Notes (Signed)
[facility] EMERGENCY DEPARTMENT Provider Note   CSN: [id number] Arrival date & time: 04/13/21  1253     History Chief Complaint  Patient presents with   Testicle Pain    Omar Hardy is a 50 y.o. male.  Omar Hardy is a 50 y.o. male with prior medical history significant for HTN and anxiety that presents today with left sided testicular pain. Patient states that two weeks ago he was moving furniture and felt a sudden sharp pain in his left testicle and groin. Since then the pain has been a constant dull ache with intermittent sharp pain. It is worse when lifting heavy objects, movement or when touched. Relieved with sitting. Pain has caused nausea but no vomiting. Denies pain or burning with urination or hematuria. Denies fevers, chills, shortness of breath, or chest pain. He is currently sexually active with his wife of 25 years. Has a history of a left hydrocele that was excised a few years ago and a history of kidney stones.  The history is provided by the patient.      Past Medical History:  Diagnosis Date   Anxiety    Hypertension    Kidney stone    MVP (mitral valve prolapse)    Panic attacks     There are no problems to display for this patient.   Past Surgical History:  Procedure Laterality Date   HYDROCELE EXCISION / REPAIR     kidney stones     WRIST TENDON TRANSFER         No family history on file.  Social History   Tobacco Use   Smoking status: Every Day    Packs/day: 0.50    Years: 10.00    Pack years: 5.00    Types: Cigarettes   Smokeless tobacco: Never  Vaping Use   Vaping Use: Never used  Substance Use Topics   Alcohol use: Yes    Comment: occassionally   Drug use: No    Home Medications Prior to Admission medications   Medication Sig Start Date End Date Taking? Authorizing Provider  levofloxacin (LEVAQUIN) 500 MG tablet Take 1 tablet (500 mg total) by mouth daily. 04/13/21  Yes Dartha Lodge, PA-C  citalopram (CELEXA) 20 MG  tablet Take 20 mg by mouth daily.    [provider]  doxycycline (VIBRAMYCIN) 100 MG capsule Take 1 capsule (100 mg total) by mouth 2 (two) times daily. 03/03/13   Rancour, Jeannett Senior, MD  ibuprofen (ADVIL,MOTRIN) 600 MG tablet Take 1 tablet (600 mg total) by mouth every 8 (eight) hours as needed. 04/07/18   Azalia Bilis, MD  ondansetron (ZOFRAN ODT) 8 MG disintegrating tablet Take 1 tablet (8 mg total) by mouth every 8 (eight) hours as needed for nausea or vomiting. 04/07/18   Azalia Bilis, MD  oxyCODONE-acetaminophen (PERCOCET/ROXICET) 5-325 MG tablet Take 1 tablet by mouth every 4 (four) hours as needed for severe pain. 04/07/18   Azalia Bilis, MD  propranolol (INDERAL) 40 MG tablet Take 40 mg by mouth daily.    [provider]  tamsulosin (FLOMAX) 0.4 MG CAPS capsule Take 1 capsule (0.4 mg total) by mouth daily. 04/07/18   Azalia Bilis, MD    Allergies    Clarithromycin  Review of Systems   Review of Systems  Constitutional:  Negative for chills and fever.  Gastrointestinal:  Positive for nausea. Negative for abdominal pain and vomiting.  Genitourinary:  Positive for testicular pain. Negative for dysuria, flank pain, frequency, penile discharge, penile pain,  penile swelling and scrotal swelling.  Musculoskeletal:  Negative for myalgias.  All other systems reviewed and are negative.  Physical Exam Updated Vital Signs BP 126/90 (BP Location: Left Arm)   Pulse 99   Temp 98.8 F (37.1 C) (Oral)   Resp 18   Ht 5\' 5"  (1.651 m)   Wt 83.9 kg   SpO2 96%   BMI 30.79 kg/m   Physical Exam Vitals and nursing note reviewed. Exam conducted with a chaperone present.  Constitutional:      General: He is not in acute distress.    Appearance: Normal appearance. He is well-developed and normal weight. He is not ill-appearing or diaphoretic.  HENT:     Head: Normocephalic and atraumatic.  Eyes:     General:        Right eye: No discharge.        Left eye: No discharge.   Cardiovascular:     Rate and Rhythm: Normal rate and regular rhythm.     Heart sounds: Normal heart sounds.  Pulmonary:     Effort: Pulmonary effort is normal. No respiratory distress.     Breath sounds: Normal breath sounds.  Abdominal:     General: Bowel sounds are normal. There is no distension.     Palpations: Abdomen is soft. There is no mass.     Tenderness: There is no abdominal tenderness. There is no guarding.  Genitourinary:    Comments: Left testicle is tender to palpation without swelling or palpable mass, no swelling or overlying skin changes over the scrotum.  No palpable hernia noted within the scrotum or inguinal canal.  No lymphadenopathy.  Right testicle is unremarkable, no tenderness. Musculoskeletal:        General: No deformity.     Cervical back: Neck supple.  Skin:    General: Skin is warm and dry.  Neurological:     Mental Status: He is alert and oriented to person, place, and time.     Coordination: Coordination normal.     Comments: Speech is clear, able to follow commands Moves extremities without ataxia, coordination intact  Psychiatric:        Mood and Affect: Mood normal.        Behavior: Behavior normal.    ED Results / Procedures / Treatments   Labs (all labs ordered are listed, but only abnormal results are displayed) Labs Reviewed  URINALYSIS, ROUTINE W REFLEX MICROSCOPIC - Abnormal; Notable for the following components:      Result Value   Hgb urine dipstick TRACE (*)    All other components within normal limits  URINALYSIS, MICROSCOPIC (REFLEX) - Abnormal; Notable for the following components:   Bacteria, UA RARE (*)    All other components within normal limits    EKG None  Radiology SCROTUM W/DOPPLER  Result Date: 04/13/2021 CLINICAL DATA:  Left testicular pain. EXAM: SCROTAL ULTRASOUND DOPPLER ULTRASOUND OF THE TESTICLES TECHNIQUE: Complete ultrasound examination of the testicles, epididymis, and other scrotal structures was  performed. Color and spectral Doppler ultrasound were also utilized to evaluate blood flow to the testicles. COMPARISON:  CT abdomen and pelvis 04/07/2018. scrotal ultrasound 03/03/2013. FINDINGS: Right testicle Measurements: 4.7 x 2.5 by 2.9. No mass or microlithiasis visualized. Left testicle Measurements: 4.5 x 2.1 x 2.4. No mass or microlithiasis visualized. Right epididymis:  Normal in size and appearance. Left epididymis:  Prominent in size and hypervascular. Hydrocele:  None visualized. Varicocele:  None visualized. Pulsed Doppler interrogation of both testes demonstrates normal  low resistance arterial and venous waveforms bilaterally. IMPRESSION: Findings compatible with left-sided epididymitis. Electronically Signed   By: Darliss Cheney M.D.   On: 04/13/2021 15:33    Procedures Procedures   Medications Ordered in ED Medications  levofloxacin (LEVAQUIN) tablet 500 mg (500 mg Oral Given 04/13/21 1639)    ED Course  I have reviewed the triage vital signs and the nursing notes.  Pertinent labs & imaging results that were available during my care of the patient were reviewed by me and considered in my medical decision making (see chart for details).    MDM Rules/Calculators/A&P                           50 year old male presents with 2 weeks of persistent left testicular pain without associated injury or trauma.  Pain worse with heavy lifting or movement and improved with rest.  On exam no evidence of inguinal hernia, but testicle is exquisitely tender to palpation without palpable mass.  Will get urinalysis and testicular ultrasound.  Differential includes epididymitis, orchitis, testicular torsion, varicocele, hydrocele, inguinal hernia.  Urinalysis without signs of infection, no hematuria noted.  Testicular ultrasound consistent with epididymitis, no other abnormalities noted.  Patient is not at risk for sexually transmitted infection, will treat with Levaquin.  First dose given here in  the ED.  Discussed treatment plan and outpatient follow-up with urology with patient and provided appropriate return precautions.  Encourage symptomatic care with supportive underwear, Motrin and Tylenol.  He reports understanding and agreement with plan.  Discharged home in good condition.  Final Clinical Impression(s) / ED Diagnoses Final diagnoses:  Epididymitis    Rx / DC Orders ED Discharge Orders          Ordered    levofloxacin (LEVAQUIN) 500 MG tablet  Daily        04/13/21 1627             Dartha Lodge, [place] 04/13/21 1640    Maia Plan, MD 04/18/21 [phone]

## 2021-04-13 NOTE — ED Triage Notes (Signed)
Pt c/o left testicle/groin pain x 2 weeks-NAD-steady gait

## 2021-04-13 NOTE — Discharge Instructions (Addendum)
Your ultrasound shows epididymitis which is an infection and inflammation of the sac that sits on top of the testicle.  This is treated with antibiotics.  Please take Levaquin 500 mg for the next 10 days.  Make sure you take this antibiotic with food on your stomach.  Avoid strenuous physical activity while on this antibiotic.    For pain you can use Motrin and Tylenol, you can also wear tighter fitting or more supportive underwear to try and reduce pain with movement.  Follow-up with urology.  If you develop fevers, new or worsening pain or other new or concerning symptoms return for reevaluation.

## 2021-04-13 NOTE — ED Notes (Signed)
Pt discharged to home. Discharge instructions have been discussed with patient and/or family members. Pt verbally acknowledges understanding d/c instructions, and endorses comprehension to checkout at registration before leaving.  °

## 2022-08-03 IMAGING — US US SCROTUM W/ DOPPLER COMPLETE
1 series · 14 of 25 positions shown · non-contrast
Comparison: CT abdomen and pelvis 04/07/2018. scrotal ultrasound
03/03/2013.

CLINICAL DATA: Left testicular pain.

EXAM:
SCROTAL ULTRASOUND
DOPPLER ULTRASOUND OF THE TESTICLES
TECHNIQUE: Complete ultrasound examination of the testicles, epididymis, and
other scrotal structures was performed. Color and spectral Doppler
ultrasound were also utilized to evaluate blood flow to the
testicles.

[Series 1: us scrotum w/ doppler complete · 14 of 41 slices shown]
[im 1/41]
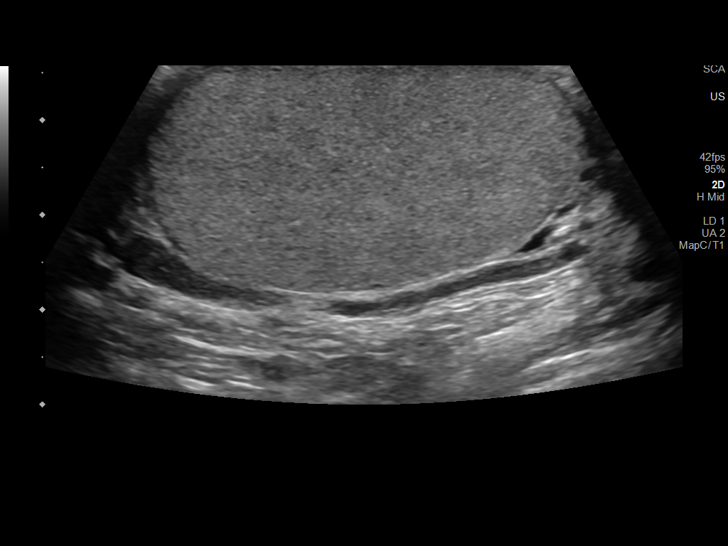
[im 4/41]
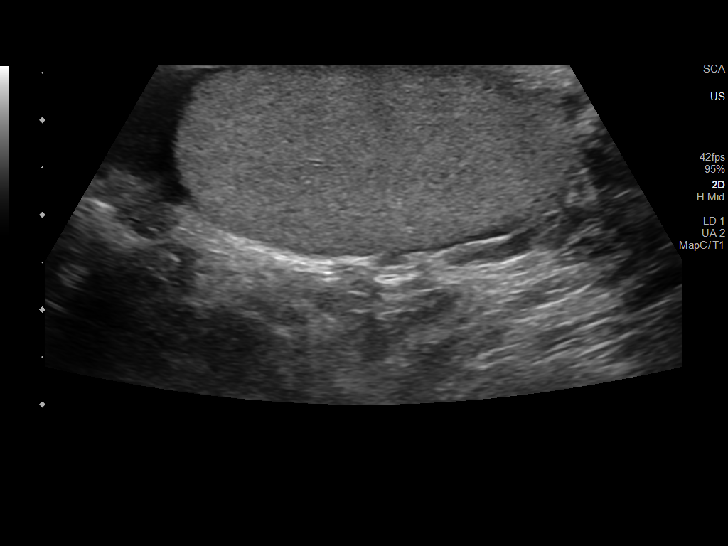
[im 7/41]
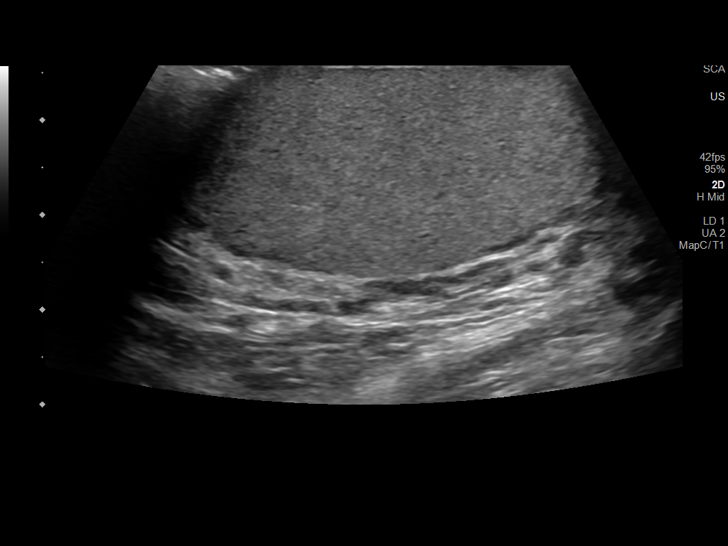
[im 11/41]
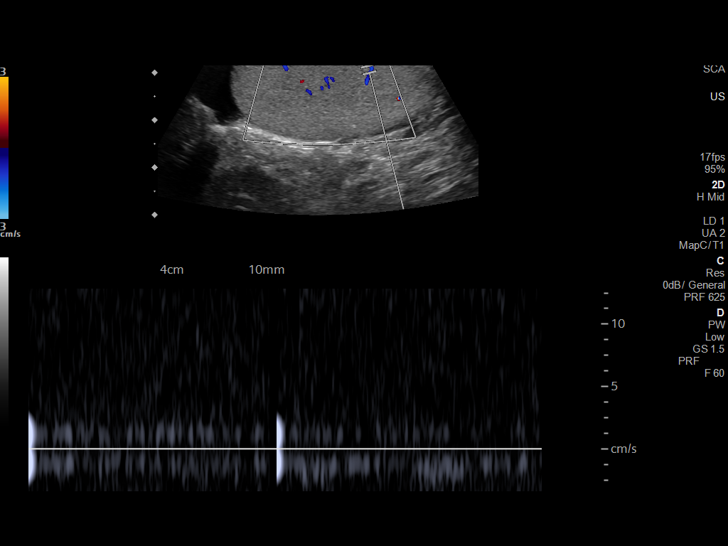
[im 14/41]
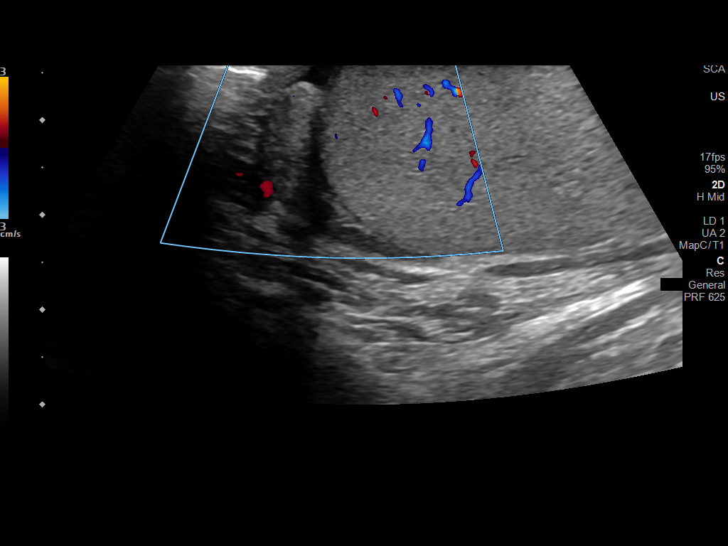
[im 16/41]
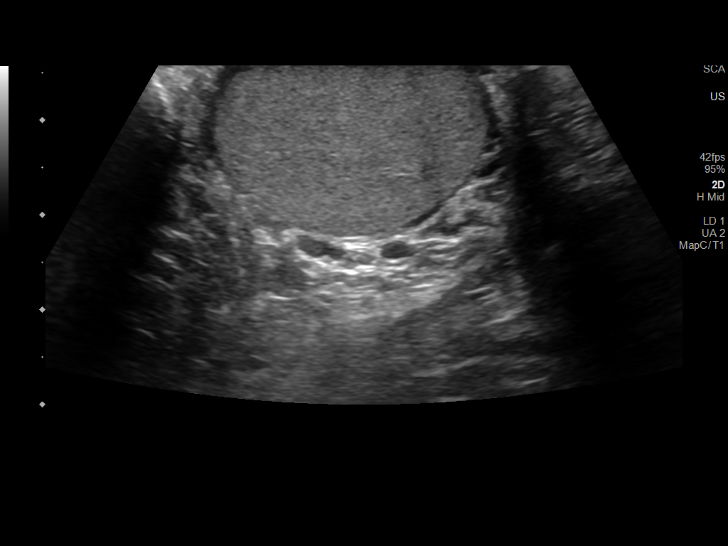
[im 19/41]
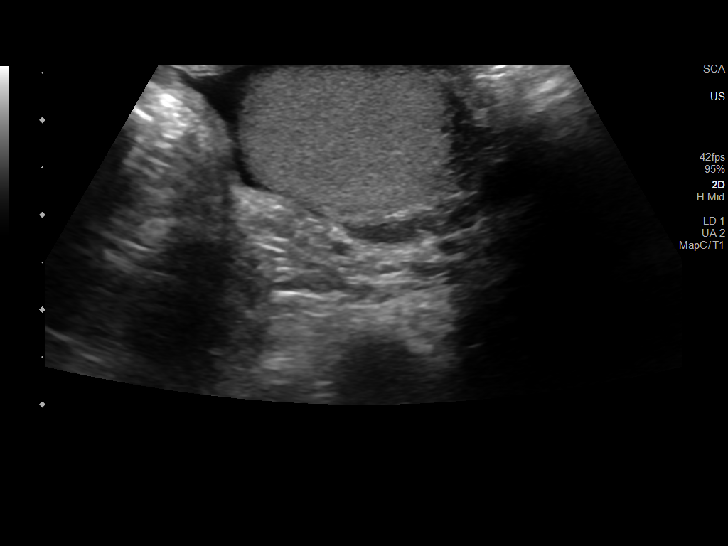
[im 22/41]
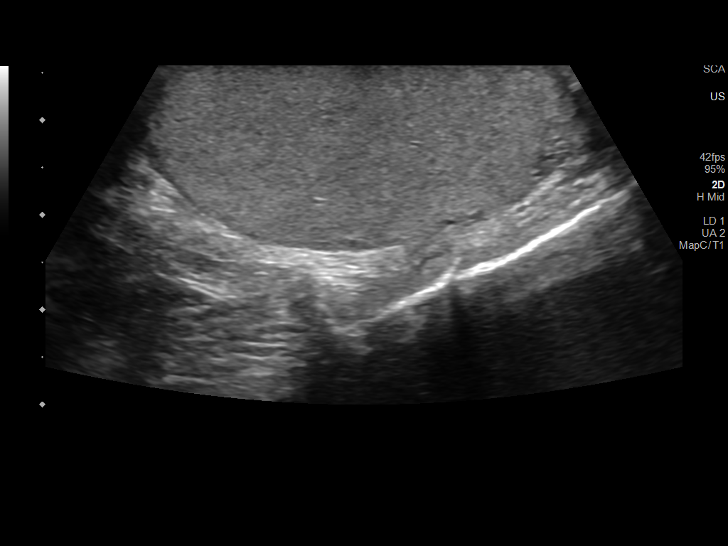
[im 26/41]
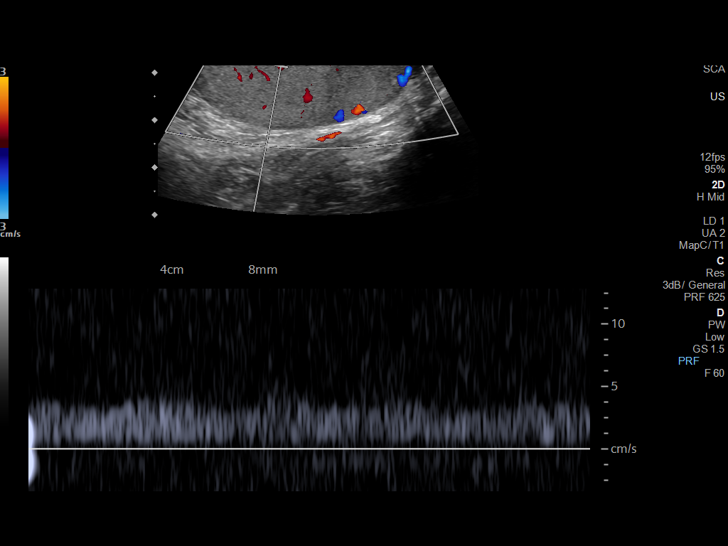
[im 27/41]
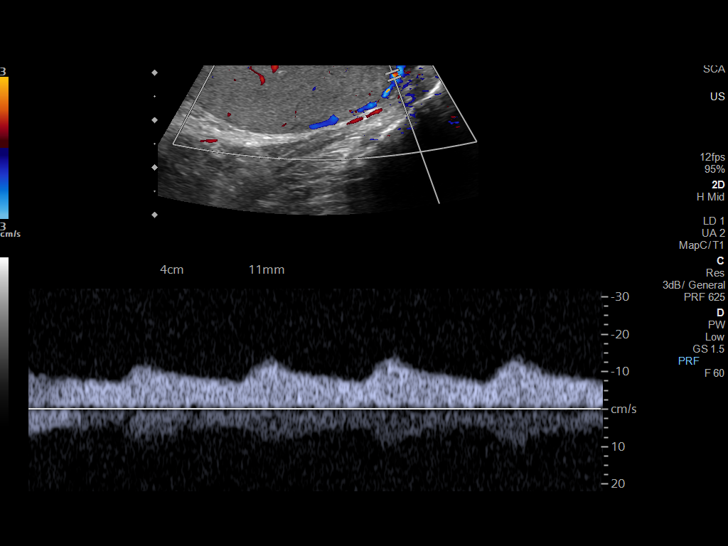
[im 31/41]
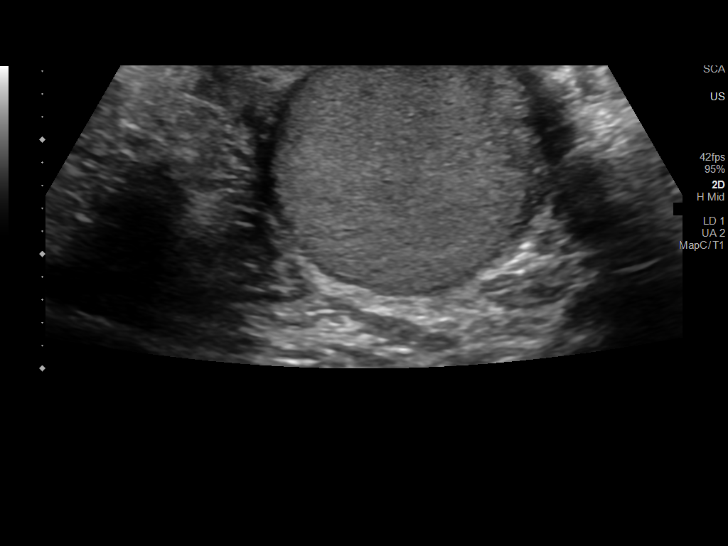
[im 34/41]
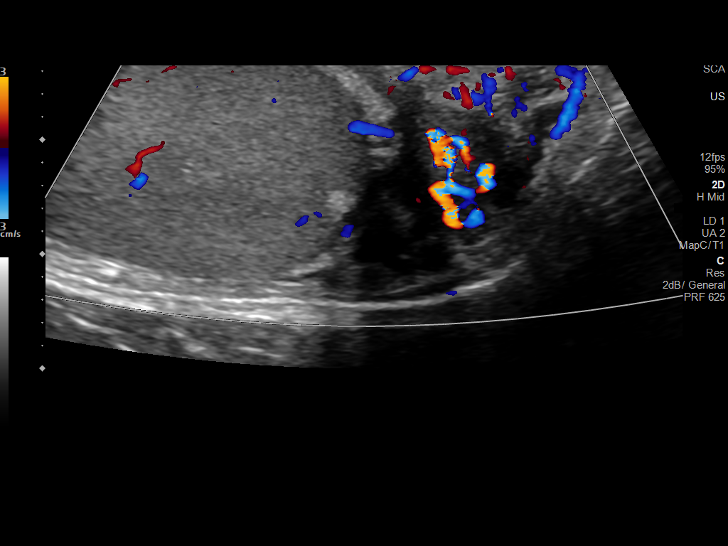
[im 37/41]
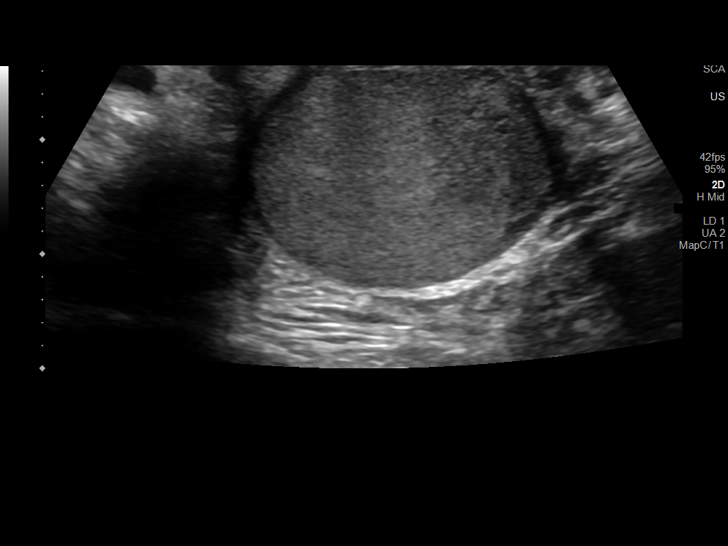
[im 41/41]
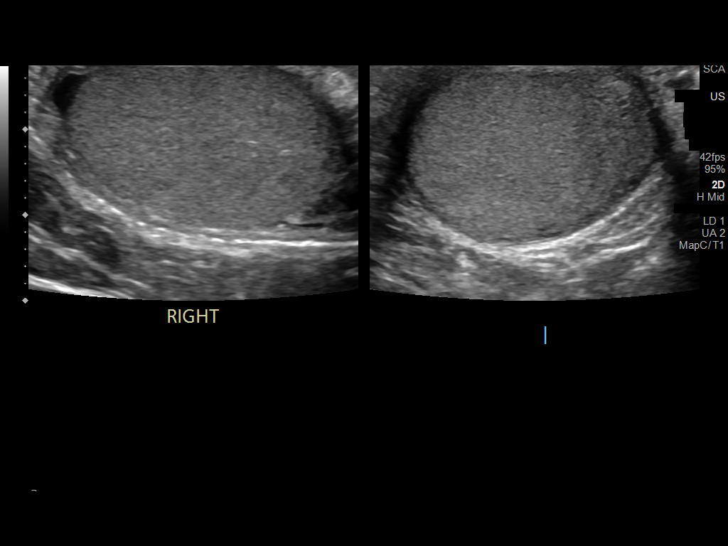

[14 of 25 positions shown; findings below may reference images not displayed]

FINDINGS: Right testicle

Measurements: 4.7 x 2.5 by 2.9. No mass or microlithiasis
visualized.

Left testicle

Measurements: 4.5 x 2.1 x 2.4. No mass or microlithiasis visualized.

Right epididymis:  Normal in size and appearance.

Left epididymis:  Prominent in size and hypervascular.

Hydrocele:  None visualized.

Varicocele:  None visualized.

Pulsed Doppler interrogation of both testes demonstrates normal low
resistance arterial and venous waveforms bilaterally.
IMPRESSION: Findings compatible with left-sided epididymitis.

## 2023-10-08 ENCOUNTER — Emergency Department (HOSPITAL_BASED_OUTPATIENT_CLINIC_OR_DEPARTMENT_OTHER)
Admission: EM | Admit: 2023-10-08 | Discharge: 2023-10-09 | Disposition: A | Discharge status: Home / Self Care | Payer: Worker's Compensation | Attending: Emergency Medicine | Admitting: Emergency Medicine

## 2023-10-08 ENCOUNTER — Encounter (HOSPITAL_BASED_OUTPATIENT_CLINIC_OR_DEPARTMENT_OTHER): Payer: Self-pay | Admitting: Urology

## 2023-10-08 DIAGNOSIS — W228XXA Striking against or struck by other objects, initial encounter: Secondary | ICD-10-CM | POA: Diagnosis not present

## 2023-10-08 DIAGNOSIS — S61211A Laceration without foreign body of left index finger without damage to nail, initial encounter: Secondary | ICD-10-CM | POA: Diagnosis present

## 2023-10-08 MED ORDER — LIDOCAINE-EPINEPHRINE (PF) 2 %-1:200000 IJ SOLN
10.0000 mL | Freq: Once | INTRAMUSCULAR | Status: AC
  Administered 2023-10-09: 10 mL
  Filled 2023-10-08: qty 20

## 2023-10-08 MED ORDER — FENTANYL CITRATE PF 50 MCG/ML IJ SOSY: 50.0000 ug | PREFILLED_SYRINGE | Freq: Once | INTRAMUSCULAR | Status: DC

## 2023-10-08 MED ORDER — FENTANYL CITRATE PF 50 MCG/ML IJ SOSY
50.0000 ug | PREFILLED_SYRINGE | Freq: Once | INTRAMUSCULAR | Status: AC
  Administered 2023-10-09: 50 ug via INTRAMUSCULAR
  Filled 2023-10-08: qty 1

## 2023-10-08 NOTE — ED Provider Notes (Incomplete)
 [place] [facility] Provider Note   CSN: [id number] Arrival date & time: 10/08/23  1843     History {Add pertinent medical, surgical, social history, OB history to HPI:1} Chief Complaint  Patient presents with  . Laceration    Omar Hardy is a 53 y.o. male presents following laceration to his left index finger that occurred this morning at work on [employer].  Denies significant pain and difficulty with range of motion, no numbness or tingling.  Was evaluated at urgent care and referred here for further evaluation.    Laceration     Past Medical History:  Diagnosis Date  . Anxiety   . Hypertension   . Kidney stone   . MVP (mitral valve prolapse)   . Panic attacks      Home Medications Prior to Admission medications   Medication Sig Start Date End Date Taking? Authorizing Provider  citalopram (CELEXA) 20 MG tablet Take 20 mg by mouth daily.    [provider]  doxycycline (VIBRAMYCIN) 100 MG capsule Take 1 capsule (100 mg total) by mouth 2 (two) times daily. 03/03/13   Rancour, Jeannett Senior, MD  ibuprofen (ADVIL,MOTRIN) 600 MG tablet Take 1 tablet (600 mg total) by mouth every 8 (eight) hours as needed. 04/07/18   Azalia Bilis, MD  levofloxacin (LEVAQUIN) 500 MG tablet Take 1 tablet (500 mg total) by mouth daily. 04/13/21   Rosezella Rumpf, PA-C  ondansetron (ZOFRAN ODT) 8 MG disintegrating tablet Take 1 tablet (8 mg total) by mouth every 8 (eight) hours as needed for nausea or vomiting. 04/07/18   Azalia Bilis, MD  oxyCODONE-acetaminophen (PERCOCET/ROXICET) 5-325 MG tablet Take 1 tablet by mouth every 4 (four) hours as needed for severe pain. 04/07/18   Azalia Bilis, MD  propranolol (INDERAL) 40 MG tablet Take 40 mg by mouth daily.    [provider]  tamsulosin (FLOMAX) 0.4 MG CAPS capsule Take 1 capsule (0.4 mg total) by mouth daily. 04/07/18   Azalia Bilis, MD      Allergies    Clarithromycin    Review of Systems    Review of Systems  Musculoskeletal:  Positive for myalgias.    Physical Exam Updated Vital Signs BP (!) 124/91 (BP Location: Right Arm)   Pulse 76   Temp 97.6 F (36.4 C) (Oral)   Resp 16   Ht 5\' 5"  (1.651 m)   Wt 83.9 kg   SpO2 96%   BMI 30.78 kg/m  Physical Exam Vitals and nursing note reviewed.  Constitutional:      General: He is not in acute distress.    Appearance: He is well-developed.  HENT:     Head: Normocephalic and atraumatic.  Eyes:     Conjunctiva/sclera: Conjunctivae normal.  Cardiovascular:     Rate and Rhythm: Normal rate and regular rhythm.     Pulses: Normal pulses.  Pulmonary:     Effort: Pulmonary effort is normal. No respiratory distress.  Musculoskeletal:        General: No swelling.     Cervical back: Neck supple.     Comments: 2 cm laceration to radial aspect of left index finger PIP.  Limited flexion, unclear if due to pain.  Hemostasis is not achieved.  Radial pulses symmetric.  NVI  Skin:    General: Skin is warm and dry.     Capillary Refill: Capillary refill takes less than 2 seconds.  Neurological:     Mental Status: He is alert.  Psychiatric:  Mood and Affect: Mood normal.     ED Results / Procedures / Treatments   Labs (all labs ordered are listed, but only abnormal results are displayed) Labs Reviewed - No data to display  EKG None  Radiology No results found.  Procedures .Laceration Repair  Date/Time: 10/08/2023 11:54 PM  Performed by: Halford Decamp, PA-C Authorized by: Halford Decamp, PA-C   Consent:    Consent obtained:  Verbal   Consent given by:  Patient   Risks discussed:  Infection and pain   Alternatives discussed:  No treatment Universal protocol:    Procedure explained and questions answered to patient or proxy's satisfaction: yes     Patient identity confirmed:  Verbally with patient and arm band Anesthesia:    Anesthesia method:  Local infiltration   Local anesthetic:  Lidocaine 2%  WITH epi Laceration details:    Location:  Finger   Finger location:  L index finger   Length (cm):  2 Treatment:    Area cleansed with:  Povidone-iodine Skin repair:    Repair method:  Sutures   Suture size:  4-0   Suture material:  Prolene   Suture technique:  Simple interrupted Repair type:    Repair type:  Simple Post-procedure details:    Procedure completion:  Tolerated   {Document cardiac monitor, telemetry assessment procedure when appropriate:1}  Medications Ordered in ED Medications  lidocaine-EPINEPHrine (XYLOCAINE W/EPI) 2 %-1:200000 (PF) injection 10 mL (has no administration in time range)  fentaNYL (SUBLIMAZE) injection 50 mcg (has no administration in time range)    ED Course/ Medical Decision Making/ A&P   {   Click here for ABCD2, HEART and other calculatorsREFRESH Note before signing :1}                              Medical Decision Making  This patient presents to the ED with chief complaint(s) of laceration.  The complaint involves an extensive differential diagnosis and also carries with it a high risk of complications and morbidity.   pertinent past medical history as listed in HPI  The differential diagnosis includes  Simple laceration, retained foreign body, tendon/vascular/nerve involvement  Additional history obtained: Records reviewed Care Everywhere/External Records  Initial Assessment:   Patient presents with laceration that occurred earlier this morning.  On exam he has a 2 cm laceration along the lateral aspect of the left index finger PIP.  Hemostasis not yet achieved.  Updated at urgent care today.  X-ray without evidence of foreign body.  Describes difficulty ranging his digit.  Unclear if this is due to pain or not.  Regardless we will require primary closure with sutures and hand follow-up.  Independent ECG interpretation:  none  Independent labs interpretation:  The following labs were independently interpreted:   none  Independent visualization and interpretation of imaging: none  Treatment and Reassessment: Digit laceration sutured closed  Consultations obtained:   none  Disposition:   Patient discharged home.  Provided referral for hand surgery. The patient has been appropriately medically screened and/or stabilized in the ED. I have low suspicion for any other emergent medical condition which would require further screening, evaluation or treatment in the ED or require inpatient management. At time of discharge the patient is hemodynamically stable and in no acute distress. I have discussed work-up results and diagnosis with patient and answered all questions. Patient is agreeable with discharge plan. We discussed strict return precautions for  returning to the emergency department and they verbalized understanding.     Social Determinants of Health:   none  This note was dictated with voice recognition software.  Despite best efforts at proofreading, errors may have occurred which can change the documentation meaning.    {Document critical care time when appropriate:1} {Document review of labs and clinical decision tools ie heart score, Chads2Vasc2 etc:1}  {Document your independent review of radiology images, and any outside records:1} {Document your discussion with family members, caretakers, and with consultants:1} {Document social determinants of health affecting pt's care:1} {Document your decision making why or why not admission, treatments were needed:1} Final Clinical Impression(s) / ED Diagnoses Final diagnoses:  Laceration of left index finger without foreign body, nail damage status unspecified, initial encounter    Rx / DC Orders ED Discharge Orders     None

## 2023-10-08 NOTE — ED Triage Notes (Signed)
 Left index finger laceration  Was working with metal sheets  Was seen and told to come to ER to make sure tendons are not torn  Unable to move finger  States had xr and it was negative, had TDAP today as well

## 2023-10-08 NOTE — Discharge Instructions (Signed)
 You were evaluated in the emergency room for a laceration.  This was sutured closed.  You are provided a referral to see a Hydrographic surveyor.  Please call make an appointment within the next week.  If you are unable to see the surgeon within 10 days please return to the emergency room.

## 2023-10-08 NOTE — ED Provider Notes (Signed)
 [place] [facility] Provider Note   CSN: [id number] Arrival date & time: 10/08/23  1843     History  Chief Complaint  Patient presents with   Laceration    Omar Hardy is a 53 y.o. male presents following laceration to his left index finger that occurred this morning at work on [employer].  Denies significant pain and difficulty with range of motion, no numbness or tingling.  Was evaluated at urgent care and referred here for further evaluation.    Laceration     Past Medical History:  Diagnosis Date   Anxiety    Hypertension    Kidney stone    MVP (mitral valve prolapse)    Panic attacks      Home Medications Prior to Admission medications   Medication Sig Start Date End Date Taking? Authorizing Provider  citalopram (CELEXA) 20 MG tablet Take 20 mg by mouth daily.    [provider]  doxycycline (VIBRAMYCIN) 100 MG capsule Take 1 capsule (100 mg total) by mouth 2 (two) times daily. 03/03/13   Rancour, Jeannett Senior, MD  ibuprofen (ADVIL,MOTRIN) 600 MG tablet Take 1 tablet (600 mg total) by mouth every 8 (eight) hours as needed. 04/07/18   Azalia Bilis, MD  levofloxacin (LEVAQUIN) 500 MG tablet Take 1 tablet (500 mg total) by mouth daily. 04/13/21   Rosezella Rumpf, PA-C  ondansetron (ZOFRAN ODT) 8 MG disintegrating tablet Take 1 tablet (8 mg total) by mouth every 8 (eight) hours as needed for nausea or vomiting. 04/07/18   Azalia Bilis, MD  oxyCODONE-acetaminophen (PERCOCET/ROXICET) 5-325 MG tablet Take 1 tablet by mouth every 4 (four) hours as needed for severe pain. 04/07/18   Azalia Bilis, MD  propranolol (INDERAL) 40 MG tablet Take 40 mg by mouth daily.    [provider]  tamsulosin (FLOMAX) 0.4 MG CAPS capsule Take 1 capsule (0.4 mg total) by mouth daily. 04/07/18   Azalia Bilis, MD      Allergies    Clarithromycin    Review of Systems   Review of Systems  Musculoskeletal:  Positive for myalgias.    Physical  Exam Updated Vital Signs BP (!) 124/91 (BP Location: Right Arm)   Pulse 76   Temp 97.6 F (36.4 C) (Oral)   Resp 16   Ht 5\' 5"  (1.651 m)   Wt 83.9 kg   SpO2 96%   BMI 30.78 kg/m  Physical Exam Vitals and nursing note reviewed.  Constitutional:      General: He is not in acute distress.    Appearance: He is well-developed.  HENT:     Head: Normocephalic and atraumatic.  Eyes:     Conjunctiva/sclera: Conjunctivae normal.  Cardiovascular:     Rate and Rhythm: Normal rate and regular rhythm.     Pulses: Normal pulses.  Pulmonary:     Effort: Pulmonary effort is normal. No respiratory distress.  Musculoskeletal:        General: No swelling.     Cervical back: Neck supple.     Comments: 2 cm laceration to radial aspect of left index finger PIP.  Limited flexion, unclear if due to pain.  Hemostasis is not achieved.  Radial pulses symmetric.  NVI  Skin:    General: Skin is warm and dry.     Capillary Refill: Capillary refill takes less than 2 seconds.  Neurological:     Mental Status: He is alert.  Psychiatric:        Mood and  Affect: Mood normal.     ED Results / Procedures / Treatments   Labs (all labs ordered are listed, but only abnormal results are displayed) Labs Reviewed - No data to display  EKG None  Radiology No results found.  Procedures .Laceration Repair  Date/Time: 10/08/2023 11:54 PM  Performed by: Halford Decamp, PA-C Authorized by: Halford Decamp, PA-C   Consent:    Consent obtained:  Verbal   Consent given by:  Patient   Risks discussed:  Infection and pain   Alternatives discussed:  No treatment Universal protocol:    Procedure explained and questions answered to patient or proxy's satisfaction: yes     Patient identity confirmed:  Verbally with patient and arm band Anesthesia:    Anesthesia method:  Local infiltration   Local anesthetic:  Lidocaine 2% WITH epi Laceration details:    Location:  Finger   Finger location:  L index  finger   Length (cm):  2 Treatment:    Area cleansed with:  Povidone-iodine Skin repair:    Repair method:  Sutures   Suture size:  4-0   Suture material:  Prolene   Suture technique:  Simple interrupted Repair type:    Repair type:  Simple Post-procedure details:    Procedure completion:  Tolerated     Medications Ordered in ED Medications  lidocaine-EPINEPHrine (XYLOCAINE W/EPI) 2 %-1:200000 (PF) injection 10 mL (has no administration in time range)  fentaNYL (SUBLIMAZE) injection 50 mcg (has no administration in time range)    ED Course/ Medical Decision Making/ A&P                                 Medical Decision Making Risk Prescription drug management.   This patient presents to the ED with chief complaint(s) of laceration.  The complaint involves an extensive differential diagnosis and also carries with it a high risk of complications and morbidity.   pertinent past medical history as listed in HPI  The differential diagnosis includes  Simple laceration, retained foreign body, tendon/vascular/nerve involvement  Additional history obtained: Records reviewed Care Everywhere/External Records  Initial Assessment:   Patient presents with laceration that occurred earlier this morning.  On exam he has a 2 cm laceration along the lateral aspect of the left index finger PIP.  Hemostasis not yet achieved.  Updated at urgent care today.  X-ray without evidence of foreign body.  Describes difficulty ranging his digit.  Unclear if this is due to pain or not.  Regardless we will require primary closure with sutures and hand follow-up.  Independent ECG interpretation:  none  Independent labs interpretation:  The following labs were independently interpreted:  none  Independent visualization and interpretation of imaging: none  Treatment and Reassessment: Digit laceration sutured closed  Consultations obtained:   none  Disposition:   Patient discharged home.   Provided referral for hand surgery. The patient has been appropriately medically screened and/or stabilized in the ED. I have low suspicion for any other emergent medical condition which would require further screening, evaluation or treatment in the ED or require inpatient management. At time of discharge the patient is hemodynamically stable and in no acute distress. I have discussed work-up results and diagnosis with patient and answered all questions. Patient is agreeable with discharge plan. We discussed strict return precautions for returning to the emergency department and they verbalized understanding.     Social Determinants of Health:  none  This note was dictated with voice recognition software.  Despite best efforts at proofreading, errors may have occurred which can change the documentation meaning.          Final Clinical Impression(s) / ED Diagnoses Final diagnoses:  Laceration of left index finger without foreign body, nail damage status unspecified, initial encounter    Rx / DC Orders ED Discharge Orders     None         Halford Decamp, PA-C 10/09/23 0032    Sloan Leiter, DO 10/09/23 1528

## 2023-10-09 MED ORDER — OXYCODONE HCL 5 MG PO TABS: 5.0000 mg | ORAL_TABLET | ORAL | 0 refills | Status: AC | PRN

## 2023-10-09 NOTE — ED Notes (Signed)
 EDP at bedside performing lac repair

## 2023-10-17 ENCOUNTER — Emergency Department (HOSPITAL_BASED_OUTPATIENT_CLINIC_OR_DEPARTMENT_OTHER)
Admission: EM | Admit: 2023-10-17 | Discharge: 2023-10-17 | Disposition: A | Discharge status: Home / Self Care | Payer: Worker's Compensation | Attending: Emergency Medicine | Admitting: Emergency Medicine

## 2023-10-17 ENCOUNTER — Encounter (HOSPITAL_BASED_OUTPATIENT_CLINIC_OR_DEPARTMENT_OTHER): Payer: Self-pay

## 2023-10-17 ENCOUNTER — Other Ambulatory Visit: Payer: Self-pay

## 2023-10-17 DIAGNOSIS — L03012 Cellulitis of left finger: Secondary | ICD-10-CM

## 2023-10-17 DIAGNOSIS — Z4800 Encounter for change or removal of nonsurgical wound dressing: Secondary | ICD-10-CM | POA: Diagnosis present

## 2023-10-17 DIAGNOSIS — L03114 Cellulitis of left upper limb: Secondary | ICD-10-CM | POA: Diagnosis not present

## 2023-10-17 DIAGNOSIS — Z4802 Encounter for removal of sutures: Secondary | ICD-10-CM

## 2023-10-17 MED ORDER — DOXYCYCLINE HYCLATE 100 MG PO TABS
100.0000 mg | ORAL_TABLET | Freq: Once | ORAL | Status: AC
  Administered 2023-10-17: 100 mg via ORAL
  Filled 2023-10-17: qty 1

## 2023-10-17 MED ORDER — DOXYCYCLINE HYCLATE 100 MG PO CAPS: 100.0000 mg | ORAL_CAPSULE | Freq: Two times a day (BID) | ORAL | 0 refills | Status: AC

## 2023-10-17 NOTE — ED Provider Notes (Signed)
 [place] [facility] Provider Note   CSN: [id number] Arrival date & time: 10/17/23  1122     History  Chief Complaint  Patient presents with   Suture / Staple Removal    Omar Hardy is a 53 y.o. male.  Patient presents to the emergency department today for reevaluation of finger laceration sustained on 10/08/2023.  Patient was sliced with a piece of sheet metal.  Patient reports increased redness and a small pustule that formed over the past 48 hours.  No drainage.  He states that he frequently accidentally hits the finger.  He lost a stitch when his dog jumped up on his hand.  No fevers.  Requesting suture removal.  Of note, previously patient was referred to hand surgery.  I can see a note where they reached out and tried to contact him for an appointment.  Patient states that he has not yet scheduled an appointment and plans to schedule immediately after ED visit today.       Home Medications Prior to Admission medications   Medication Sig Start Date End Date Taking? Authorizing Provider  doxycycline (VIBRAMYCIN) 100 MG capsule Take 1 capsule (100 mg total) by mouth 2 (two) times daily. 10/17/23  Yes Renne Crigler, PA-C  citalopram (CELEXA) 20 MG tablet Take 20 mg by mouth daily.    [provider]  oxyCODONE (ROXICODONE) 5 MG immediate release tablet Take 1 tablet (5 mg total) by mouth every 4 (four) hours as needed for severe pain (pain score 7-10). 10/09/23   Halford Decamp, PA-C  propranolol (INDERAL) 40 MG tablet Take 40 mg by mouth daily.    [provider]      Allergies    Clarithromycin    Review of Systems   Review of Systems  Physical Exam Updated Vital Signs BP (!) 111/93 (BP Location: Right Arm)   Pulse (!) 106   Temp 98.3 F (36.8 C)   Resp 18   Ht 5\' 5"  (1.651 m)   Wt 81.6 kg   SpO2 94%   BMI 29.95 kg/m   Physical Exam Vitals and nursing note reviewed.  Constitutional:      Appearance: He  is well-developed.  HENT:     Head: Normocephalic and atraumatic.  Eyes:     Conjunctiva/sclera: Conjunctivae normal.  Pulmonary:     Effort: No respiratory distress.  Musculoskeletal:     Cervical back: Normal range of motion and neck supple.     Comments: Patient with Prolene sutures in place.  There is erythema and a small pustule on the dorsum of the finger, at the level of the PIP joint and proximally.  No streaking redness.  Patient is able to flex the digit somewhat but does not have full range of motion.  Strength is somewhat reduced.  Patient does not have significant tenderness over the flexor tendon sheath, erythema over the flexor tendon, and I am able to extend the finger without significant pain.  Low concern clinically for septic arthritis or flexor tenosynovitis at this time.  Skin:    General: Skin is warm and dry.  Neurological:     Mental Status: He is alert.         ED Results / Procedures / Treatments   Labs (all labs ordered are listed, but only abnormal results are displayed) Labs Reviewed - No data to display  EKG None  Radiology No results found.  Procedures Suture Removal  Date/Time: 10/17/2023 12:39  PM  Performed by: Renne Crigler, PA-C Authorized by: Renne Crigler, PA-C   Consent:    Consent obtained:  Verbal   Consent given by:  Patient   Risks discussed:  Wound separation, pain and bleeding   Alternatives discussed:  Delayed treatment Universal protocol:    Patient identity confirmed:  Verbally with patient and provided demographic data Location:    Location:  Upper extremity   Upper extremity location:  Hand   Hand location:  L index finger Procedure details:    Wound appearance:  Purulent, warm and red   Number of sutures removed:  6 Post-procedure details:    Procedure completion:  Tolerated well, no immediate complications Comments:     Very minimal dehiscence over the dorsum of the finger at the area of erythema.  I would  refer this to be open regardless due to signs of cellulitis.  Sutures were removed without complications.     Medications Ordered in ED Medications  doxycycline (VIBRA-TABS) tablet 100 mg (100 mg Oral Given 10/17/23 1223)    ED Course/ Medical Decision Making/ A&P    Patient seen and examined. History obtained directly from patient.  I reviewed previous ED visit notes and telephone notes from hand surgery.  Labs/EKG: None ordered  Imaging: None ordered  Medications/Fluids: Ordered: P.o. doxycycline  Most recent vital signs reviewed and are as follows: BP (!) 120/91   Pulse 99   Temp 98.3 F (36.8 C)   Resp 17   Ht 5\' 5"  (1.651 m)   Wt 81.6 kg   SpO2 95%   BMI 29.95 kg/m   Initial impression: Finger laceration, healing, concern for wound infection  I have personally called Dr. Jenny Reichmann office in regards to follow-up appointment.  They were very helpful in getting an appointment set up for 4/14 at 2:30 PM at the latest.  They are looking into possibly having the patient see an APP prior to this.  They will call patient with appointment time if that is possible.  Patient will be placed into a finger splint, started on doxycycline here and given a prescription for home.  Stressed importance of follow-up.  Discussed that we do not want him to develop a flexor tendon infection or a septic joint infection.  I have low concern for this at this time and do not think that he needs to be transferred, however close follow-up will be paramount.  Also cannot entirely rule out a flexor tendon injury, at least partial injury.  Patient voices understanding.  He will look out for a phone call from the hand surgery office.  Discussed return to the emergency department with worsening pain, redness, swelling, fever, if redness expands up into the hand and up the arm or if you develop severe pain with movement.    Click here for ABCD2, HEART and other calculatorsREFRESH Note before signing  :1}                              Medical Decision Making Risk Prescription drug management.   Patient with laceration 10 days ago.  Sutures removed today.  It does seem that he has developed a bit of cellulitis over the past 2 days.  Patient started on doxycycline for this reason.  Symptoms are not significant enough to require transfer for hand surgery evaluation currently, but will need very close follow-up.  I called to obtain follow-up for the patient.  He seems reliable  to return with worsening.        Final Clinical Impression(s) / ED Diagnoses Final diagnoses:  Encounter for removal of sutures  Cellulitis of finger of left hand    Rx / DC Orders ED Discharge Orders          Ordered    doxycycline (VIBRAMYCIN) 100 MG capsule  2 times daily        10/17/23 1234              Renne Crigler, PA-C 10/17/23 1357    Terrilee Files, MD 10/17/23 1715

## 2023-10-17 NOTE — ED Triage Notes (Signed)
 Pt reports that he is here for stiches to be removed from his left index finger. Finger is noted to be red and possibly infected.

## 2023-10-17 NOTE — Discharge Instructions (Signed)
 You have an appointment scheduled with Dr. Kerry Fort on 10/22/2023 at 2:30 PM.  Please arrive 15 minutes early.  The staff there is trying to see if they can schedule you for an earlier appointment with one of the APP's there.  They will contact you by telephone if this is possible.  Please keep an eye out for the call.  Please return if your finger becomes more red or swollen, you have increasing pain or fevers.  Take the doxycycline prescribed.
